# Patient Record
Sex: Female | Born: 1970 | Race: Asian | Hispanic: Yes | Marital: Married | Smoking: Never smoker
Health system: Southern US, Community
[De-identification: ages and names within clinical notes are randomized; demographics above are authoritative.]

---

## 2018-10-18 ENCOUNTER — Encounter (HOSPITAL_COMMUNITY): Payer: Self-pay

## 2018-10-23 ENCOUNTER — Encounter (HOSPITAL_COMMUNITY): Payer: Self-pay | Admitting: Emergency Medicine

## 2018-10-23 ENCOUNTER — Ambulatory Visit (HOSPITAL_COMMUNITY)
Admission: EM | Admit: 2018-10-23 | Discharge: 2018-10-23 | Disposition: A | Discharge status: Home / Self Care | Payer: Self-pay | Attending: Family Medicine | Admitting: Family Medicine

## 2018-10-23 DIAGNOSIS — R21 Rash and other nonspecific skin eruption: Secondary | ICD-10-CM

## 2018-10-23 NOTE — Discharge Instructions (Addendum)
Please try taking benadryl at night  Pleas try allegra, claritin or xyzal during the day  Please follow up if 2-3 days if your symptoms fail to improve.   Ronchas Hives Las ronchas son zonas enrojecidas e hinchadas en la piel que ocasionan picazn. Las ronchas pueden aparecer en cualquier parte del cuerpo. Las ronchas suelen mejorar en el transcurso de 24 horas (ronchas [place]). Pueden aparecer ronchas nuevas despus de que las viejas desaparecen. Esto puede seguir [employer] o semanas (ronchas crnicas). No se transmiten de persona a persona (no son contagiosas). Las ronchas son causadas por la respuesta del organismo a algo a lo que usted es Best boy (alrgeno). A veces se los llama factores desencadenantes. Puede tener ronchas inmediatamente despus de estar cerca de un factor desencadenante u horas ms tarde. Cules son las causas? Las [employer] a los alimentos. Las picaduras o mordeduras de insectos. Polen. Mascotas. Ltex. Productos qumicos. Pasar tiempo a la luz del sol, al calor o al fro. Realizar actividad fsica. Estrs. Algunos medicamentos. Virus. Esto incluye el resfro comn. Las infecciones causadas por grmenes (bacterias). Vacunas contra la Programmer, multimedia. Transfusiones de [place]. A veces, la causa es desconocida. Qu incrementa el riesgo? Ser mujer. Ser alrgico a alimentos tales como: Frutas ctricas. Leche. Huevos. Manes. Frutos secos. Mariscos. Ser alrgico a: Medicamentos. Ltex. Insectos. Animales. Polen. Cules son los signos o los sntomas?  Protuberancias o reas elevadas en la piel, de color rojo o blanco y que producen picazn. Estas reas: Se hacen grandes y se hinchan. [place] de forma y [place]. Aparecen solas o se conectan entre s en una gran rea de piel. Pinchan o causar un dolor punzante. Pueden volverse blancas (palidecer) al presionar en el centro. En [employer] graves, las [place], los pies y el rostro tambin pueden  hincharse. Esto puede ocurrir si las ronchas comienzan en las capas profundas de la piel. Cmo se trata? El tratamiento de esta afeccin depende de los sntomas. El tratamiento puede incluir: Usar paos fros y hmedos (compresas fras) o tomar duchas con agua fra para Psychologist, sport and exercise. Medicamentos para lo siguiente: Associate Professor (antihistamnicos). Reducir la hinchazn (corticoesteroides). Tratar la infeccin (antibiticos). Un medicamento (omalizumab) que se aplica como una inyeccin. El mdico puede recetarle esto si usted presenta ronchas que no mejoran an despus de otros tratamientos. En casos muy graves, puede necesitar una inyeccin de un medicamento denominado epinefrinapara prevenir una reaccin alrgica potencialmente mortal (anafilaxis). Siga estas indicaciones en su casa: Medicamentos [place] o aplique los medicamentos de venta libre y los recetados solamente como se lo haya indicado el mdico. Si le recetaron un antibitico, tmelo como se lo haya indicado el mdico. No deje de usarlo aunque comience a sentirse mejor. Cuidado de la piel Aplique paos fros y hmedos en las ronchas. No se rasque la piel. No se frote la piel. Indicaciones generales No se duche ni tome baos de inmersin con agua caliente. Podra empeorar la picazn. No use ropa ajustada. Aplquese pantalla solar y use ropas que le cubran la piel cuando est al [place]. Evite los factores desencadenantes que le causan las ronchas. Lleve un registro para Presenter, broadcasting de aquello que le causa ronchas. [place] los siguientes datos: Los medicamentos que toma. Lo que usted come y bebe. Los productos que [place] en la piel. Concurra a todas las visitas de [address] se lo haya indicado el mdico. Esto es importante. Comunquese con un mdico si: Los sntomas no mejoran con los medicamentos. Le duelen las  articulaciones o estn hinchadas. Solicite ayuda inmediatamente si: Tiene  fiebre. Siente dolor en el vientre (abdomen). Tiene la lengua o los labios hinchados. Tiene los prpados hinchados. Siente el pecho o la garganta cerrados. Tiene problemas para respirar o tragar. Estos sntomas pueden Customer service manager. No espere a ver si los sntomas desaparecen. Solicite atencin mdica de inmediato. Comunquese con el servicio de emergencias de su localidad (911 en los [place]). No conduzca por sus propios medios [facility]. Resumen Las ronchas son zonas enrojecidas e hinchadas en la piel que ocasionan picazn. El tratamiento de esta afeccin depende de los sntomas. Evite las cosas que causan las ronchas. Lleve un registro para Presenter, broadcasting de aquello que le causa ronchas. Tome y [employer] medicamentos de venta libre y los recetados solamente como se lo haya indicado el mdico. Oceanographer a todas las visitas de seguimiento como se lo haya indicado el mdico. Esto es importante. Esta informacin no tiene Theme park manager el consejo del mdico. Asegrese de hacerle al mdico cualquier pregunta que tenga.

## 2018-10-23 NOTE — ED Provider Notes (Signed)
[facility]    CSN: [id number] Arrival date & time: 10/23/18  1742     History   Chief Complaint Chief Complaint  Patient presents with  . Rash    HPI Virginia Mathis is a 48 y.o. female. Virginia Mathis is a 48 year old female is presenting with rash.  Seems like the symptoms get worse over the past week.  There is only occurring on her face.  She denies any trouble swallowing.  Has not tried any medications.  Denies any new or different exposures.  Has been doing the same job for the past 3 years.  Is mainly occurring just on her face and nowhere else.  No shortness of breath or trouble swallowing.  No new or different medications.  HPI  History reviewed. No pertinent past medical history.  There are no active problems to display for this patient.   History reviewed. No pertinent surgical history.  OB History   No obstetric history on file.      Home Medications    Prior to Admission medications   Not on File    Family History Family History  Family history unknown: Yes    Social History Social History   Tobacco Use  . Smoking status: Never Smoker  . Smokeless tobacco: Never Used  Substance Use Topics  . Alcohol use: Not Currently  . Drug use: Never     Allergies   Patient has no known allergies.   Review of Systems Review of Systems  Constitutional: Negative for fever.  HENT: Negative for congestion.   Respiratory: Negative for cough.   Cardiovascular: Negative for chest pain.  Gastrointestinal: Negative for abdominal pain.  Musculoskeletal: Negative for back pain.  Skin: Positive for rash.  Psychiatric/Behavioral: Negative for agitation.     Physical Exam Triage Vital Signs ED Triage Vitals [10/23/18 1947]  Enc Vitals Group     BP (!) 133/95     Pulse Rate 70     Resp 18     Temp 98.6 F (37 C)     Temp Source Oral     SpO2 100 %     Weight      Height      Head Circumference      Peak Flow      Pain Score 0     Pain Loc    Pain Edu?      Excl. in GC?    No data found.  Updated Vital Signs BP (!) 133/95 (BP Location: Right Arm)   Pulse 70   Temp 98.6 F (37 C) (Oral)   Resp 18   SpO2 100%   Visual Acuity Right Eye Distance:   Left Eye Distance:   Bilateral Distance:    Right Eye Near:   Left Eye Near:    Bilateral Near:     Physical Exam Gen: NAD, alert, cooperative with exam, well-appearing ENT: normal lips, normal nasal mucosa,  Eye: normal EOM, normal conjunctiva and lids CV:  no edema, +2 pedal pulses   Resp: no accessory muscle use, non-labored,  Skin: Raised, erythematous plaques occurring on the face Neuro: normal tone, normal sensation to touch Psych:  normal insight, alert and oriented MSK: Normal gait, normal strength  UC Treatments / Results  Labs (all labs ordered are listed, but only abnormal results are displayed) Labs Reviewed - No data to display  EKG None  Radiology No results found.  Procedures Procedures (including critical care time)  Medications Ordered in UC Medications -  No data to display  Initial Impression / Assessment and Plan / UC Course  I have reviewed the triage vital signs and the nursing notes.  Pertinent labs & imaging results that were available during my care of the patient were reviewed by me and considered in my medical decision making (see chart for details).     Virginia Mathis is a 48 year old female is presenting with rash.  Symptoms seem to be consistent with urticaria.  No new or different exposures to explain.  Counseled on using antihistamines.  Given indications to follow-up and return.  . Final Clinical Impressions(s) / UC Diagnoses   Final diagnoses:  Rash     Discharge Instructions     Please try taking benadryl at night  Pleas try allegra, claritin or xyzal during the day  Please follow up if 2-3 days if your symptoms fail to improve.   Virginia Mathis Las [place] son zonas enrojecidas e hinchadas en la piel que ocasionan  picazn. Las [place] pueden aparecer en cualquier parte del cuerpo. Las [place] suelen mejorar en el transcurso de 24 horas ([place] Virginia Mathis). Pueden aparecer [place] nuevas despus de que las viejas desaparecen. Esto puede seguir Virginia Mathis o semanas ([place] crnicas). No se transmiten de persona a persona (no son contagiosas). Las [place] son causadas por la respuesta del organismo a algo a lo que usted es Virginia Mathis (alrgeno). A veces se los llama factores desencadenantes. Puede tener [place] inmediatamente despus de estar cerca de un factor desencadenante u horas ms tarde. Cules son las causas?  Las [employer] a los alimentos.  Las picaduras o mordeduras de insectos.  Polen.  Mascotas.  Ltex.  Productos qumicos.  Pasar tiempo a la luz del sol, al calor o al fro.  Realizar actividad fsica.  Estrs.  Algunos medicamentos.  Virus. Esto incluye el resfro comn.  Las infecciones causadas por grmenes (bacterias).  Vacunas contra la Programmer, multimedia.  Transfusiones de [place]. A veces, la causa es desconocida. Qu incrementa el riesgo?  Ser mujer.  Ser alrgico a alimentos tales como: ? Frutas ctricas. ? Leche. ? Huevos. ? Manes. ? Frutos secos. ? Mariscos.  Ser alrgico a: ? Medicamentos. ? Ltex. ? Insectos. ? Animales. ? Polen. Cules son los signos o los sntomas?   Protuberancias o reas elevadas en la piel, de color rojo o blanco y que producen picazn. Estas reas: ? Se hacen grandes y se hinchan. ? [place] de forma y [place]. ? Aparecen solas o se conectan entre s en una gran rea de piel. ? Pinchan o causar un dolor punzante. ? Pueden volverse blancas (palidecer) al presionar en el centro. En [employer] graves, las [place], los pies y el rostro tambin pueden hincharse. Esto puede ocurrir si las [place] comienzan en las capas profundas de la piel. Cmo se trata? El tratamiento de esta afeccin depende de los sntomas. El tratamiento puede  incluir:  Usar paos fros y hmedos (compresas fras) o tomar duchas con agua fra para Psychologist, sport and exercise.  Medicamentos para lo siguiente: ? Aliviar la picazn (antihistamnicos). ? Reducir la hinchazn (corticoesteroides). ? Tratar la infeccin (antibiticos).  Un medicamento (omalizumab) que se aplica como una inyeccin. El mdico puede recetarle esto si usted presenta [place] que no mejoran an despus de otros tratamientos.  En casos muy graves, puede necesitar una inyeccin de un medicamento denominado epinefrinapara prevenir una reaccin alrgica potencialmente mortal (anafilaxis). Siga estas indicaciones en su casa: Medicamentos  Tome o aplique los medicamentos de venta libre y los recetados  solamente como se lo haya indicado el mdico.  Si le recetaron un antibitico, tmelo como se lo haya indicado el mdico. No deje de usarlo aunque comience a sentirse mejor. Cuidado de la piel  Aplique paos fros y hmedos en las [place].  No se rasque la piel. No se frote la piel. Indicaciones generales  No se duche ni tome baos de inmersin con agua caliente. Podra empeorar la picazn.  No use ropa ajustada.  Aplquese pantalla solar y use ropas que le cubran la piel cuando est al [place].  Evite los factores desencadenantes que le causan las [place]. Lleve un registro para Presenter, broadcasting de aquello que le causa [place]. Escriba los siguientes datos: ? Los medicamentos que toma. ? Lo que usted come y bebe. ? Los productos que [place] en la piel.  Concurra a todas las visitas de [address] se lo haya indicado el mdico. Esto es importante. Comunquese con un mdico si:  Los sntomas no mejoran con los medicamentos.  Le duelen las articulaciones o estn hinchadas. Solicite ayuda inmediatamente si:  Tiene fiebre.  Siente dolor en el vientre (abdomen).  Tiene la lengua o los labios hinchados.  Tiene los prpados hinchados.  Siente el pecho o la  garganta cerrados.  Tiene problemas para respirar o tragar. Estos sntomas pueden Customer service manager. No espere a ver si los sntomas desaparecen. Solicite atencin mdica de inmediato. Comunquese con el servicio de emergencias de su localidad (911 en los [place]). No conduzca por sus propios medios [facility] hospital. Resumen  Las [place] son zonas enrojecidas e hinchadas en la piel que ocasionan picazn.  El tratamiento de esta afeccin depende de los sntomas.  Evite las cosas que causan las [place]. Lleve un registro para Presenter, broadcasting de aquello que le causa [place].  Tome y [employer] medicamentos de venta libre y los recetados solamente como se lo haya indicado el mdico.  Oceanographer a todas las visitas de seguimiento como se lo haya indicado el mdico. Esto es importante. Esta informacin no tiene Theme park manager el consejo del mdico. Asegrese de hacerle al mdico cualquier pregunta que tenga.    ED Prescriptions    None     Controlled Substance Prescriptions [place] Controlled Substance Registry consulted? Not Applicable   Myra Rude, MD 10/23/18 2146

## 2018-10-23 NOTE — ED Triage Notes (Signed)
Pt here for rash and itching to face; pt thinks it is from allergies

## 2019-04-18 ENCOUNTER — Other Ambulatory Visit (HOSPITAL_COMMUNITY): Payer: Self-pay | Admitting: *Deleted

## 2019-04-18 DIAGNOSIS — Z1231 Encounter for screening mammogram for malignant neoplasm of breast: Secondary | ICD-10-CM

## 2019-07-12 ENCOUNTER — Encounter (HOSPITAL_COMMUNITY): Payer: Self-pay

## 2019-07-12 ENCOUNTER — Ambulatory Visit (HOSPITAL_COMMUNITY)
Admission: RE | Admit: 2019-07-12 | Discharge: 2019-07-12 | Disposition: A | Discharge status: Home / Self Care | Payer: Self-pay | Source: Ambulatory Visit | Attending: Obstetrics and Gynecology | Admitting: Obstetrics and Gynecology

## 2019-07-12 ENCOUNTER — Other Ambulatory Visit: Payer: Self-pay

## 2019-07-12 ENCOUNTER — Ambulatory Visit
Admission: RE | Admit: 2019-07-12 | Discharge: 2019-07-12 | Disposition: A | Discharge status: Home / Self Care | Payer: No Typology Code available for payment source | Source: Ambulatory Visit | Attending: Obstetrics and Gynecology | Admitting: Obstetrics and Gynecology

## 2019-07-12 DIAGNOSIS — Z1231 Encounter for screening mammogram for malignant neoplasm of breast: Secondary | ICD-10-CM

## 2019-07-12 DIAGNOSIS — Z1239 Encounter for other screening for malignant neoplasm of breast: Secondary | ICD-10-CM | POA: Insufficient documentation

## 2019-07-12 NOTE — Patient Instructions (Signed)
Explained breast self awareness with Kathrin Greathouse. Patient did not need a Pap smear today due to last Pap smear was in February 2020 per patient. Let her know [facility] will cover Pap smears every 3 years unless has a history of abnormal Pap smears. Referred patient to the [facility] for a screening mammogram. Appointment scheduled for Thursday, July 12, 2019 at 1610. Patient aware of appointment and will be there. Let patient know the [facility] will follow up with her within the next couple weeks with results of mammogram by letter or phone. Marice Guidone verbalized understanding.  Abraham Margulies, Arvil Chaco, RN 3:20 PM

## 2019-07-12 NOTE — Progress Notes (Signed)
No complaints today.   Pap Smear: Pap smear not completed today. Last Pap smear was in February 2020 at the [facility] and normal per patient. Per patient has no history of an abnormal Pap smear. No Pap smear results are in Epic.  Physical exam: Breasts Breasts symmetrical. No skin abnormalities bilateral breasts. No nipple retraction bilateral breasts. No nipple discharge bilateral breasts. No lymphadenopathy. No lumps palpated bilateral breasts. No complaints of pain or tenderness on exam. Referred patient to the [facility] for a screening mammogram. Appointment scheduled for Thursday, July 12, 2019 at 1610.        Pelvic/Bimanual No Pap smear completed today since last Pap smear was in February 2020 per patient. Pap smear not indicated per [facility] guidelines.   Smoking History: Patient has never smoked.  Patient Navigation: Patient education provided. Access to services provided for patient through [facility] program. Spanish interpreter provided.   Breast and Cervical Cancer Risk Assessment: Patient has no family history of breast cancer, known genetic mutations, or radiation treatment to the chest before age 15. Patient has no history of cervical dysplasia, immunocompromised, or DES exposure in-utero.  Risk Assessment    Risk Scores      07/12/2019   Last edited by: Loletta Parish, RN   5-year risk: 0.5 %   Lifetime risk: 5.4 %         Used Spanish interpreter Rudene Anda from [place].

## 2019-07-18 ENCOUNTER — Other Ambulatory Visit: Payer: Self-pay | Admitting: Obstetrics and Gynecology

## 2019-07-18 DIAGNOSIS — R928 Other abnormal and inconclusive findings on diagnostic imaging of breast: Secondary | ICD-10-CM

## 2019-09-17 ENCOUNTER — Other Ambulatory Visit: Payer: Self-pay

## 2019-09-17 ENCOUNTER — Ambulatory Visit
Admission: RE | Admit: 2019-09-17 | Discharge: 2019-09-17 | Disposition: A | Discharge status: Home / Self Care | Payer: No Typology Code available for payment source | Source: Ambulatory Visit | Attending: Obstetrics and Gynecology | Admitting: Obstetrics and Gynecology

## 2019-09-17 ENCOUNTER — Other Ambulatory Visit: Payer: Self-pay | Admitting: Obstetrics and Gynecology

## 2019-09-17 DIAGNOSIS — R928 Other abnormal and inconclusive findings on diagnostic imaging of breast: Secondary | ICD-10-CM

## 2019-09-17 DIAGNOSIS — R921 Mammographic calcification found on diagnostic imaging of breast: Secondary | ICD-10-CM

## 2019-09-17 DIAGNOSIS — N6310 Unspecified lump in the right breast, unspecified quadrant: Secondary | ICD-10-CM

## 2019-09-26 ENCOUNTER — Ambulatory Visit
Admission: RE | Admit: 2019-09-26 | Discharge: 2019-09-26 | Disposition: A | Discharge status: Home / Self Care | Payer: No Typology Code available for payment source | Source: Ambulatory Visit | Attending: Obstetrics and Gynecology | Admitting: Obstetrics and Gynecology

## 2019-09-26 ENCOUNTER — Other Ambulatory Visit: Payer: Self-pay

## 2019-09-26 DIAGNOSIS — R921 Mammographic calcification found on diagnostic imaging of breast: Secondary | ICD-10-CM

## 2019-09-26 DIAGNOSIS — N6310 Unspecified lump in the right breast, unspecified quadrant: Secondary | ICD-10-CM

## 2019-09-26 HISTORY — PX: BREAST BIOPSY: SHX20

## 2019-12-22 ENCOUNTER — Ambulatory Visit: Payer: No Typology Code available for payment source | Attending: Internal Medicine

## 2019-12-22 DIAGNOSIS — Z23 Encounter for immunization: Secondary | ICD-10-CM

## 2019-12-22 NOTE — Progress Notes (Signed)
   Covid-19 Vaccination Clinic  Name:  Virginia Mathis    MRN: [id number] DOB: 1971-04-30  12/22/2019  Ms. Virginia Mathis was observed post Covid-19 immunization for 15 minutes without incident. She was provided with Vaccine Information Sheet and instruction to access the V-Safe system.   Ms. Virginia Mathis was instructed to call 911 with any severe reactions post vaccine: Marland Kitchen Difficulty breathing  . Swelling of face and throat  . A fast heartbeat  . A bad rash all over body  . Dizziness and weakness   Immunizations Administered    Name Date Dose VIS Date Route   Pfizer COVID-19 Vaccine 12/22/2019 10:34 AM 0.3 mL 10/17/2018 Intramuscular   Manufacturer: [employer], [employer]   Lot: [id number]   NDC: [address]-8

## 2020-01-01 ENCOUNTER — Ambulatory Visit
Admission: EM | Admit: 2020-01-01 | Discharge: 2020-01-01 | Disposition: A | Discharge status: Home / Self Care | Payer: Self-pay | Attending: Physician Assistant | Admitting: Physician Assistant

## 2020-01-01 DIAGNOSIS — R059 Cough, unspecified: Secondary | ICD-10-CM

## 2020-01-01 DIAGNOSIS — R519 Headache, unspecified: Secondary | ICD-10-CM

## 2020-01-01 DIAGNOSIS — R05 Cough: Secondary | ICD-10-CM

## 2020-01-01 DIAGNOSIS — J029 Acute pharyngitis, unspecified: Secondary | ICD-10-CM

## 2020-01-01 MED ORDER — PREDNISONE 50 MG PO TABS: 50.0000 mg | ORAL_TABLET | Freq: Every day | ORAL | 0 refills | Status: DC

## 2020-01-01 MED ORDER — FLUTICASONE PROPIONATE 50 MCG/ACT NA SUSP: 2.0000 | Freq: Every day | NASAL | 0 refills | Status: AC

## 2020-01-01 MED ORDER — LIDOCAINE VISCOUS HCL 2 % MT SOLN: OROMUCOSAL | 0 refills | Status: DC

## 2020-01-01 MED ORDER — FLUTICASONE PROPIONATE 50 MCG/ACT NA SUSP: 2.0000 | Freq: Every day | NASAL | 0 refills | Status: DC

## 2020-01-01 NOTE — ED Provider Notes (Signed)
[facility]    CSN: [id number] Arrival date & time: 01/01/20  1340      History   Chief Complaint Chief Complaint  Patient presents with  . Sore Throat    HPI Beautiful Pensyl Ashlye Oviedo is a 49 y.o. female.   49 year old female comes in for 5 day of URI symptoms. Sore throat, body aches, cough, frontal headache. Denies fever, chills. Denies abdominal pain, nausea, vomiting, diarrhea. Denies shortness of breath, loss of taste/smell. States had first pfizer covid vaccine a week ago Saturday. Rapid COVID test yesterday negative     History reviewed. No pertinent past medical history.  Patient Active Problem List   Diagnosis Date Noted  . Screening breast examination 07/12/2019    History reviewed. No pertinent surgical history.  OB History    Gravida  3   Para      Term      Preterm      AB      Living  3     SAB      TAB      Ectopic      Multiple      Live Births  3            Home Medications    Prior to Admission medications   Medication Sig Start Date End Date Taking? Authorizing Provider  fluticasone (FLONASE) 50 MCG/ACT nasal spray Place 2 sprays into both nostrils daily. 01/01/20   Cathie Hoops, Correen Bubolz V, PA-C  lidocaine (XYLOCAINE) 2 % solution 5-15 mL gurgle as needed 01/01/20   Cathie Hoops, Lafayette Dunlevy V, PA-C  predniSONE (DELTASONE) 50 MG tablet Take 1 tablet (50 mg total) by mouth daily with breakfast. 01/01/20   Belinda Fisher, PA-C    Family History Family History  Problem Relation Age of Onset  . Diabetes Mother   . Diabetes Father     Social History Social History   Tobacco Use  . Smoking status: Never Smoker  . Smokeless tobacco: Never Used  Substance Use Topics  . Alcohol use: Yes    Comment: occ.  . Drug use: Never     Allergies   Patient has no known allergies.   Review of Systems Review of Systems  Reason unable to perform ROS: See HPI as above.     Physical Exam Triage Vital Signs ED Triage Vitals [01/01/20 1355]    Enc Vitals Group     BP 113/80     Pulse Rate 95     Resp 18     Temp 98.1 F (36.7 C)     Temp Source Oral     SpO2 95 %     Weight      Height      Head Circumference      Peak Flow      Pain Score 8     Pain Loc      Pain Edu?      Excl. in GC?    No data found.  Updated Vital Signs BP 113/80 (BP Location: Left Arm)   Pulse 95   Temp 98.1 F (36.7 C) (Oral)   Resp 18   LMP 12/31/2019   SpO2 95%   Physical Exam Constitutional:      General: She is not in acute distress.    Appearance: Normal appearance. She is well-developed. She is not ill-appearing, toxic-appearing or diaphoretic.  HENT:     Head: Normocephalic and atraumatic.     Right Ear:  Ear canal and external ear normal. A middle ear effusion is present. Tympanic membrane is not erythematous or bulging.     Left Ear: Tympanic membrane, ear canal and external ear normal. Tympanic membrane is not erythematous or bulging.     Nose:     Right Sinus: No maxillary sinus tenderness or frontal sinus tenderness.     Left Sinus: No maxillary sinus tenderness or frontal sinus tenderness.     Mouth/Throat:     Mouth: Mucous membranes are moist.     Pharynx: Oropharynx is clear. Uvula midline.     Tonsils: No tonsillar exudate. 1+ on the right. 1+ on the left.  Eyes:     Conjunctiva/sclera: Conjunctivae normal.     Pupils: Pupils are equal, round, and reactive to light.  Cardiovascular:     Rate and Rhythm: Normal rate and regular rhythm.  Pulmonary:     Effort: Pulmonary effort is normal. No accessory muscle usage, prolonged expiration, respiratory distress or retractions.     Breath sounds: No decreased air movement or transmitted upper airway sounds. No decreased breath sounds.     Comments: LCTAB Musculoskeletal:     Cervical back: Normal range of motion and neck supple.  Skin:    General: Skin is warm and dry.  Neurological:     Mental Status: She is alert and oriented to person, place, and time.       UC Treatments / Results  Labs (all labs ordered are listed, but only abnormal results are displayed) Labs Reviewed  NOVEL CORONAVIRUS, NAA    EKG   Radiology No results found.  Procedures Procedures (including critical care time)  Medications Ordered in UC Medications - No data to display  Initial Impression / Assessment and Plan / UC Course  I have reviewed the triage vital signs and the nursing notes.  Pertinent labs & imaging results that were available during my care of the patient were reviewed by me and considered in my medical decision making (see chart for details).    Due to high false negative rate for rapid COVID tests, will send for COVID PCR testing. Patient to quarantine until testing results return. Symptomatic treatment discussed. Return precautions given.  Final Clinical Impressions(s) / UC Diagnoses   Final diagnoses:  Sore throat  Cough  Frontal headache   ED Prescriptions    Medication Sig Dispense Auth. Provider   fluticasone (FLONASE) 50 MCG/ACT nasal spray  (Status: Discontinued) Place 2 sprays into both nostrils daily. 1 g Klani Caridi V, PA-C   lidocaine (XYLOCAINE) 2 % solution  (Status: Discontinued) 5-15 mL gurgle as needed 150 mL Johnrobert Foti V, PA-C   predniSONE (DELTASONE) 50 MG tablet  (Status: Discontinued) Take 1 tablet (50 mg total) by mouth daily with breakfast. 5 tablet Faustine Tates V, PA-C   fluticasone (FLONASE) 50 MCG/ACT nasal spray Place 2 sprays into both nostrils daily. 1 g Franziska Podgurski V, PA-C   lidocaine (XYLOCAINE) 2 % solution 5-15 mL gurgle as needed 150 mL Levon Boettcher V, PA-C   predniSONE (DELTASONE) 50 MG tablet Take 1 tablet (50 mg total) by mouth daily with breakfast. 5 tablet Ok Edwards, PA-C     PDMP not reviewed this encounter.   Ok Edwards, PA-C 01/01/20 1417

## 2020-01-01 NOTE — ED Triage Notes (Signed)
Pt c/o sore throat, body aches, cough and headaches since Thursday. States had first covid vaccine a week ago saturday

## 2020-01-01 NOTE — Discharge Instructions (Addendum)
COVID PCR testing ordered. I would like you to quarantine until testing results. Prednisone for frontal headache. Flonase for right ear fluid. Start lidocaine for sore throat, do not eat or drink for the next 40 mins after use as it can stunt your gag reflex.Tylenol/motrin for pain and fever. Keep hydrated, urine should be clear to pale yellow in color. If experiencing shortness of breath, trouble breathing, go to the emergency department for further evaluation needed.

## 2020-01-02 LAB — NOVEL CORONAVIRUS, NAA: SARS-CoV-2, NAA: DETECTED — AB

## 2020-01-02 LAB — SARS-COV-2, NAA 2 DAY TAT

## 2020-01-03 ENCOUNTER — Telehealth: Payer: Self-pay | Admitting: Unknown Physician Specialty

## 2020-01-03 NOTE — Telephone Encounter (Signed)
Phone contact in error

## 2020-01-14 ENCOUNTER — Ambulatory Visit: Payer: No Typology Code available for payment source | Attending: Internal Medicine

## 2021-05-14 IMAGING — MG DIGITAL DIAGNOSTIC UNILAT RIGHT W/ CAD
3 series · 3 of 3 positions shown · non-contrast
Comparison: Screening study, which was the baseline exam,
07/12/2019.

CLINICAL DATA: Screening recall for right breast calcifications.

EXAM:
DIGITAL DIAGNOSTIC RIGHT MAMMOGRAM
ULTRASOUND RIGHT BREAST

[R CC]
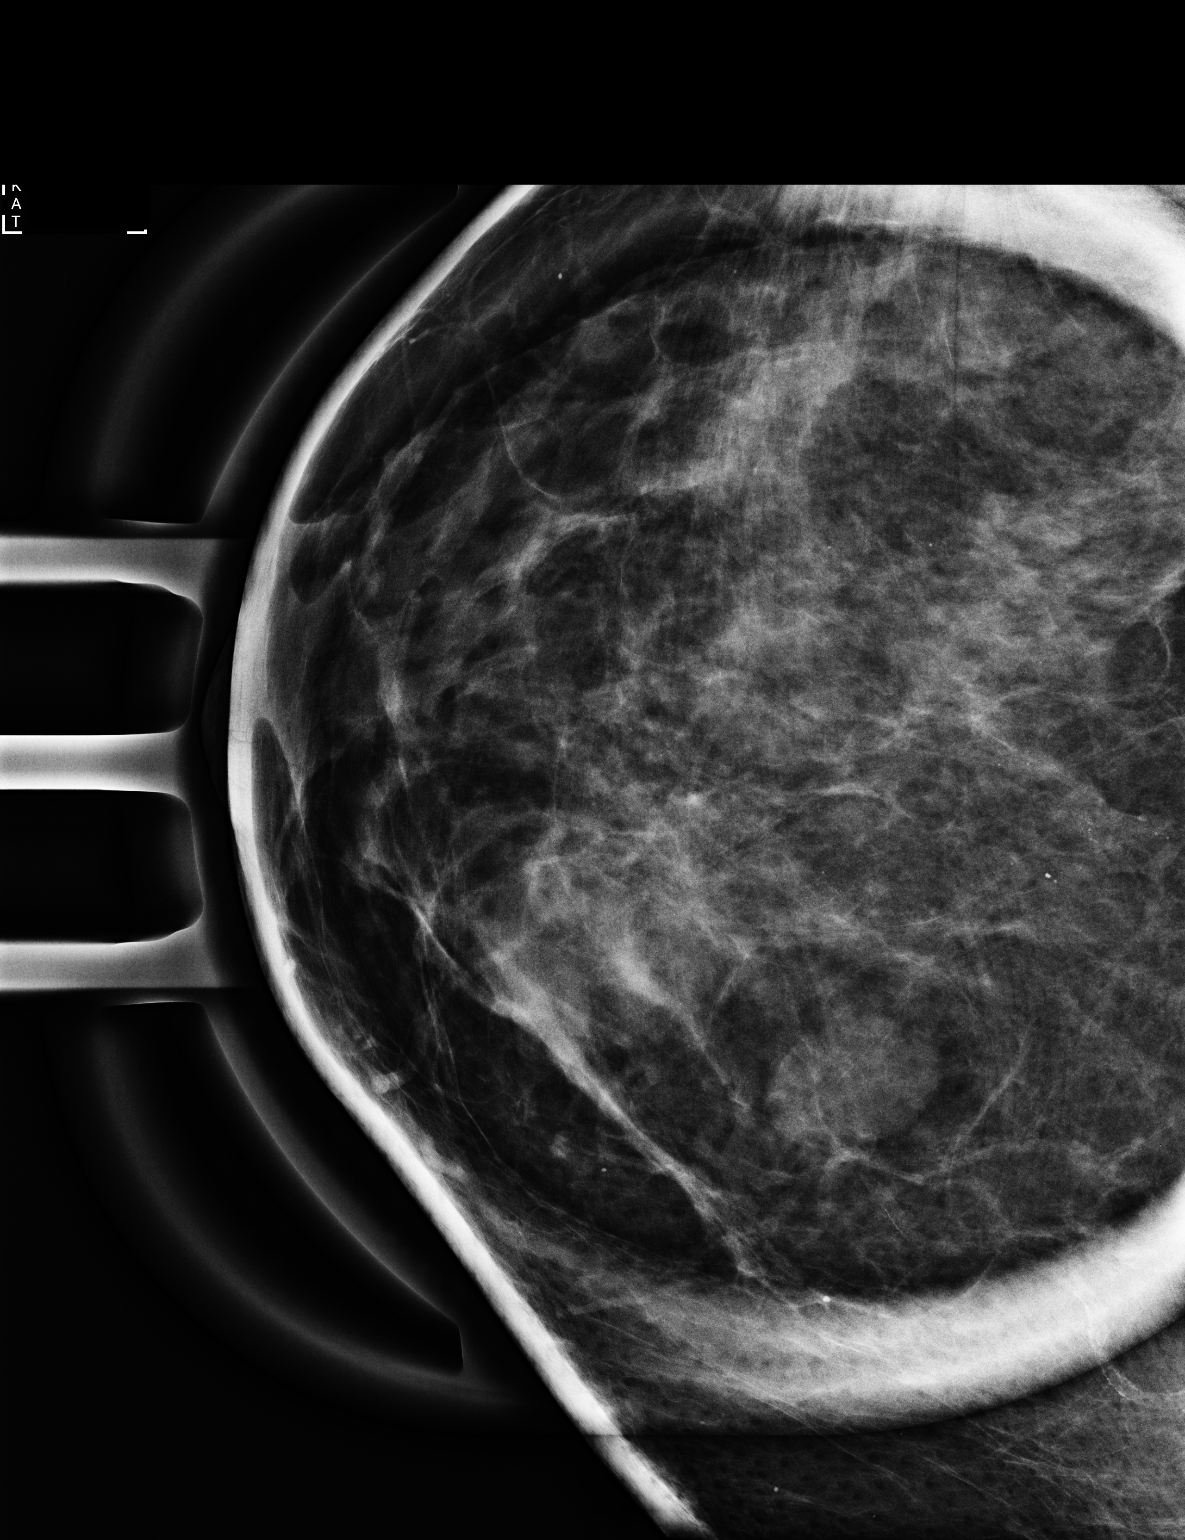

[R ML (1 of 2)]
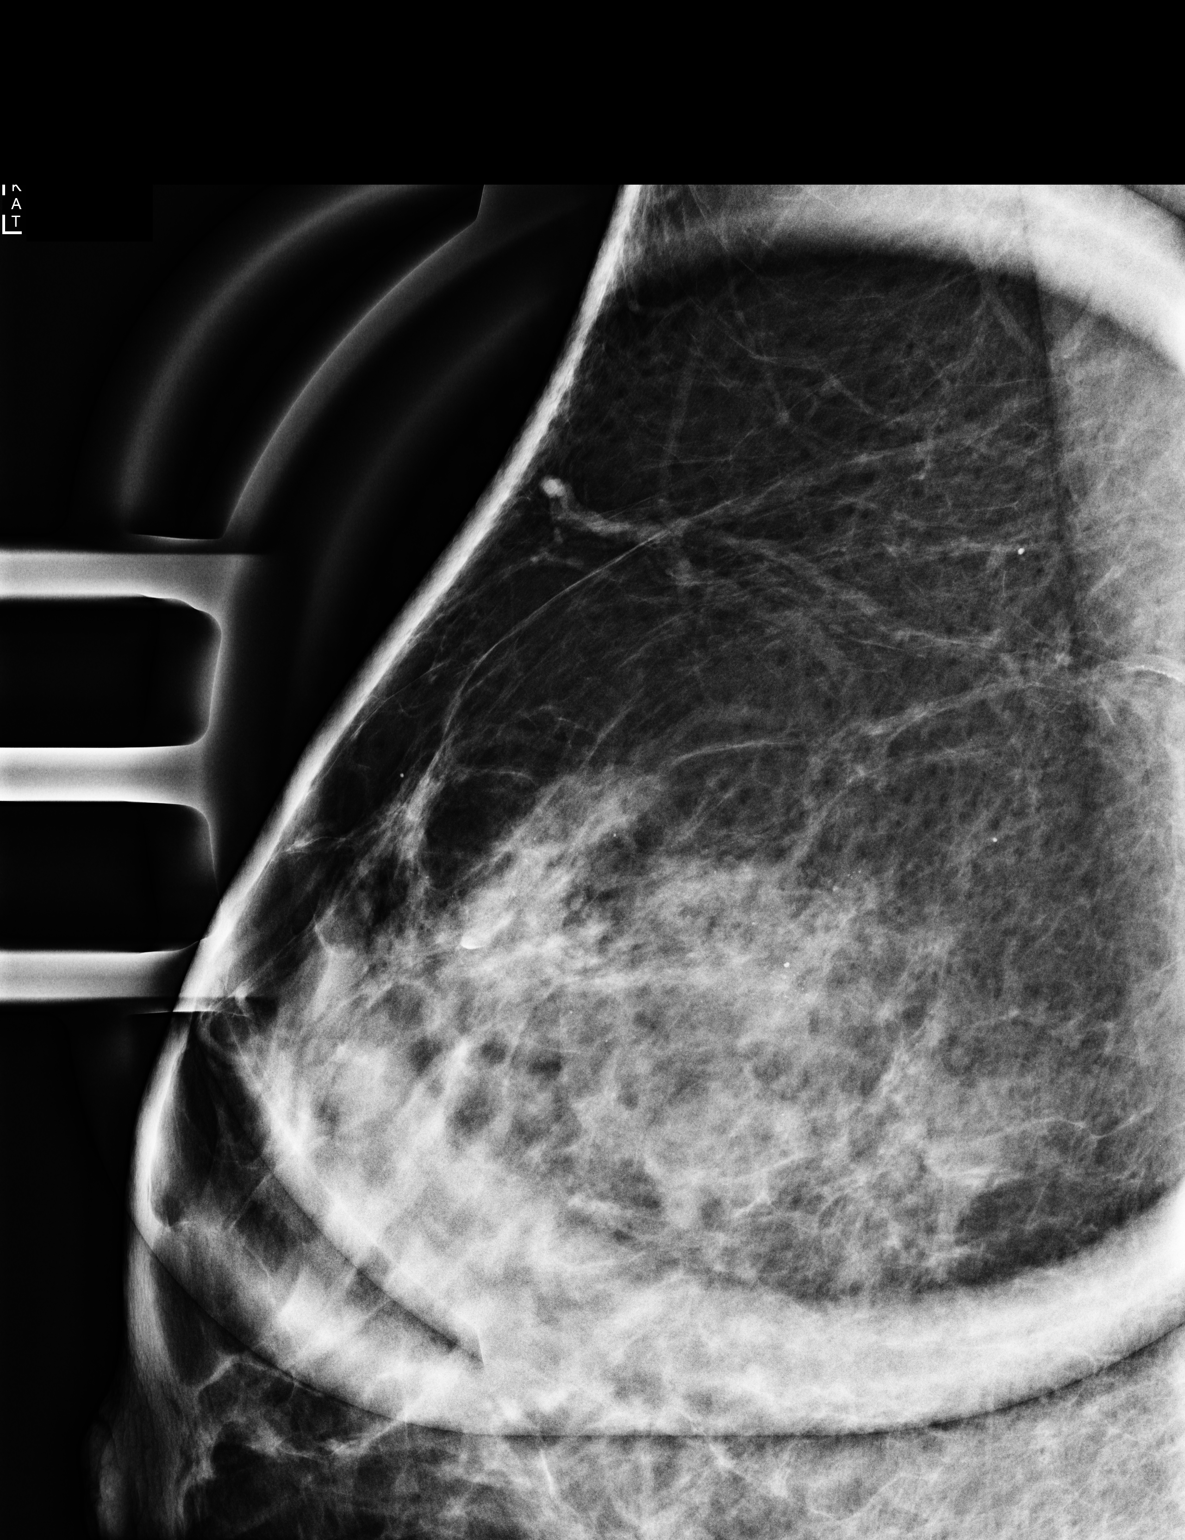

[R ML (2 of 2)]
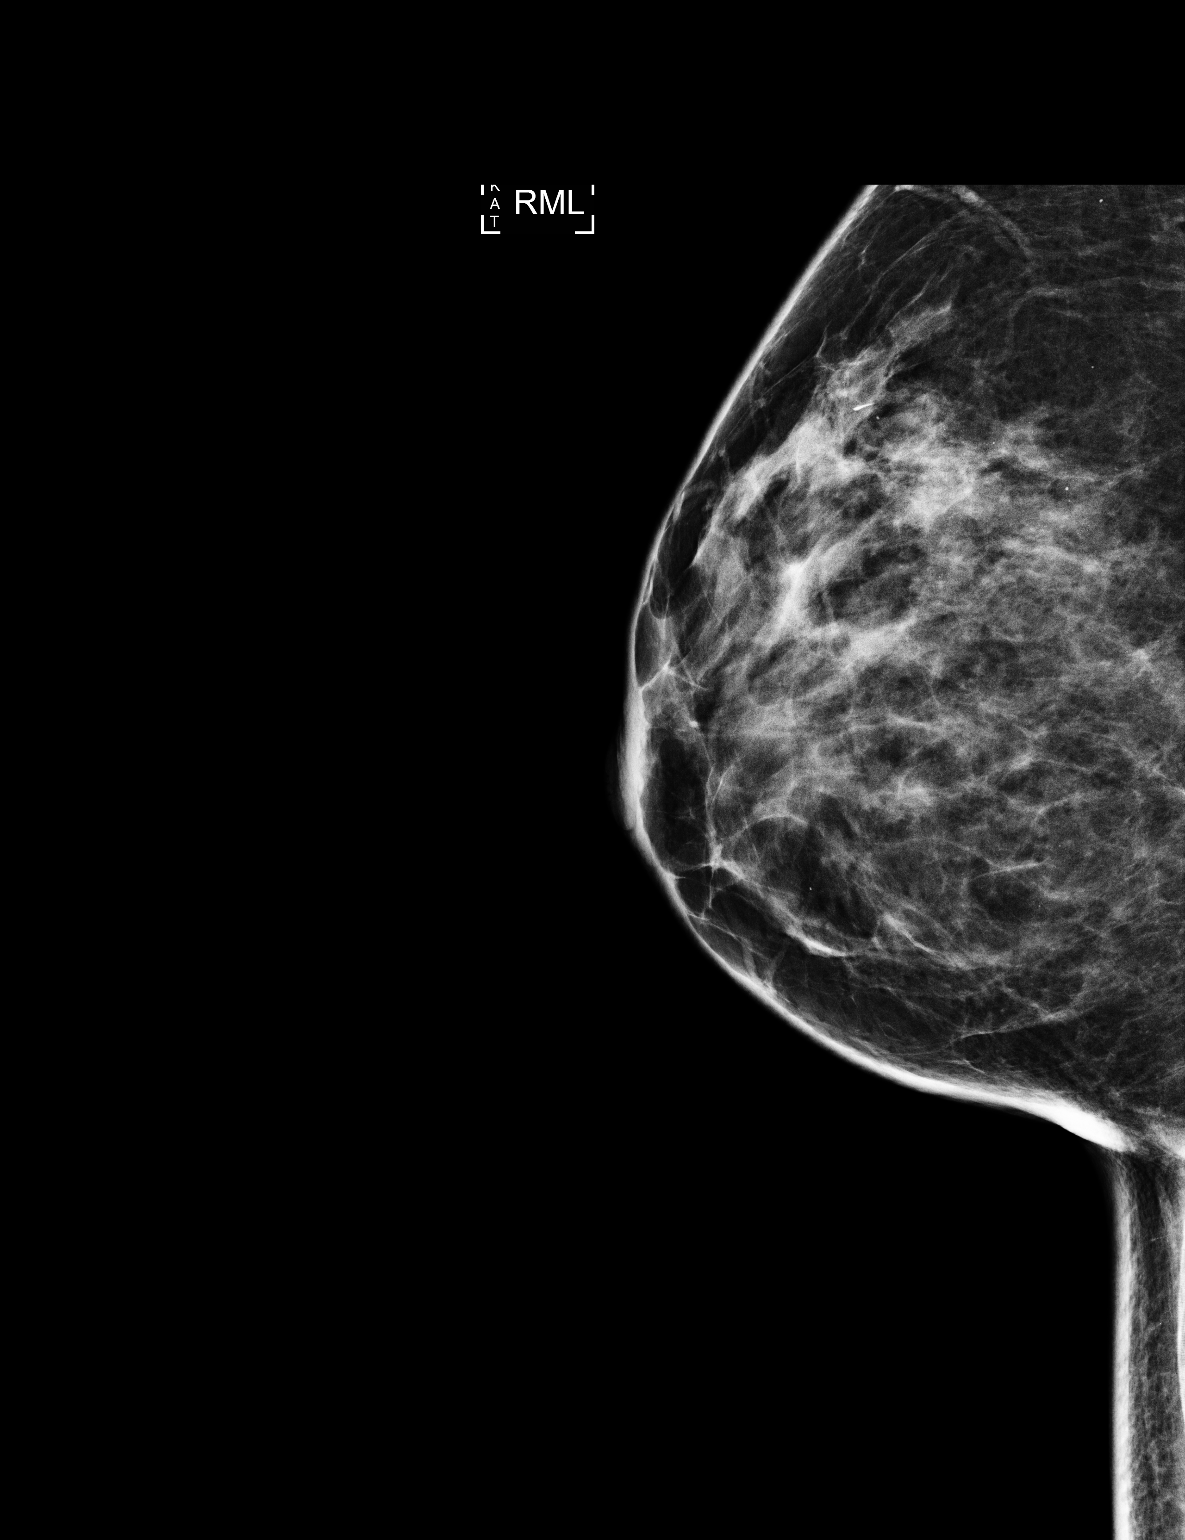

[3 of 3 positions shown; findings below may reference images not displayed]

ACR Breast Density Category c: The breast tissue is heterogeneously
dense, which may obscure small masses.
FINDINGS: The calcifications in the posterior, upper central right breast, are
better evaluated on the magnification images. These are
predominantly punctate, but are somewhat linearly arranged spanning
1.5 cm in long axis. Within these areas a more focal group that
spans 5 mm. There is no associated mass or distortion.

Inferior and medial to the calcifications, there is a circumscribed
oval mass. No other right breast abnormalities.

Targeted ultrasound is performed, showing an oval circumscribed
hypoechoic mass in the right breast at 12:30 o'clock, 2 cm the
nipple, measuring 11 x 6 x 11 mm, corresponding to the mammographic
mass. Sonographic evaluation of the right axilla shows no enlarged
or abnormal lymph nodes.
IMPRESSION: 1. Indeterminate right breast calcifications. Tissue sampling is
recommended.
2. Probably benign right breast mass. Since the calcifications in
this breasts are to undergo biopsy, biopsy is also recommended for
this mass to confirm it as a benign lesion.

RECOMMENDATION:
1. Stereotactic core needle biopsy of the right breast
calcifications.
2. Ultrasound-guided core needle biopsy of the 12:30 o'clock
position right breast mass.

These procedures were scheduled prior to the patient leaving the
[REDACTED].

I have discussed the findings and recommendations with the patient.
If applicable, a reminder letter will be sent to the patient
regarding the next appointment.

BI-RADS CATEGORY  4: Suspicious.

## 2021-05-14 IMAGING — US US BREAST*R* LIMITED INC AXILLA
1 series · 7 of 7 positions shown · non-contrast
Comparison: Screening study, which was the baseline exam,
07/12/2019.

CLINICAL DATA: Screening recall for right breast calcifications.

EXAM:
DIGITAL DIAGNOSTIC RIGHT MAMMOGRAM
ULTRASOUND RIGHT BREAST

[Series 1: us breast*right* limited inc axilla · 0.07mm/px · 7 of 7 slices shown]
[im 1/7]
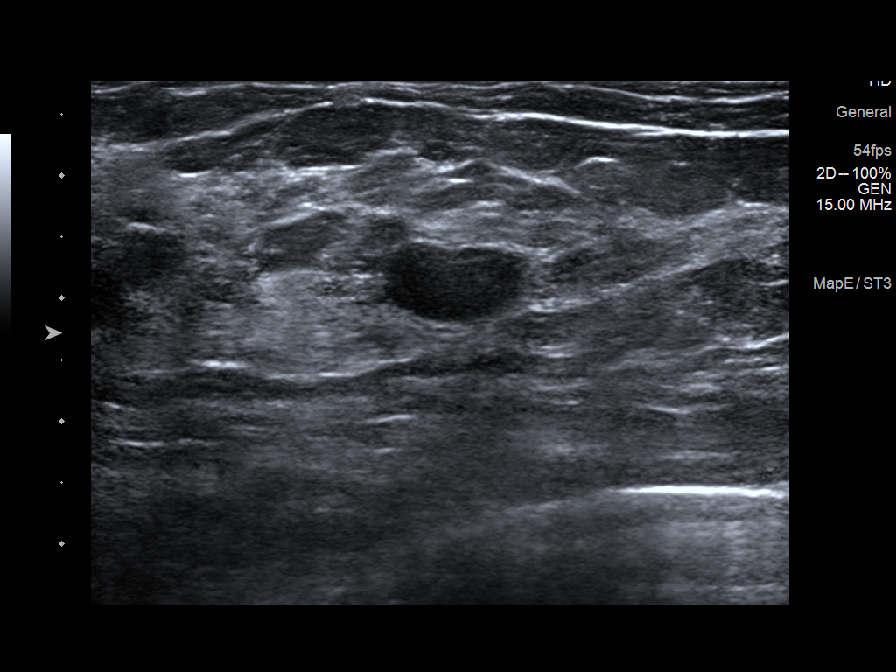
[im 2/7]
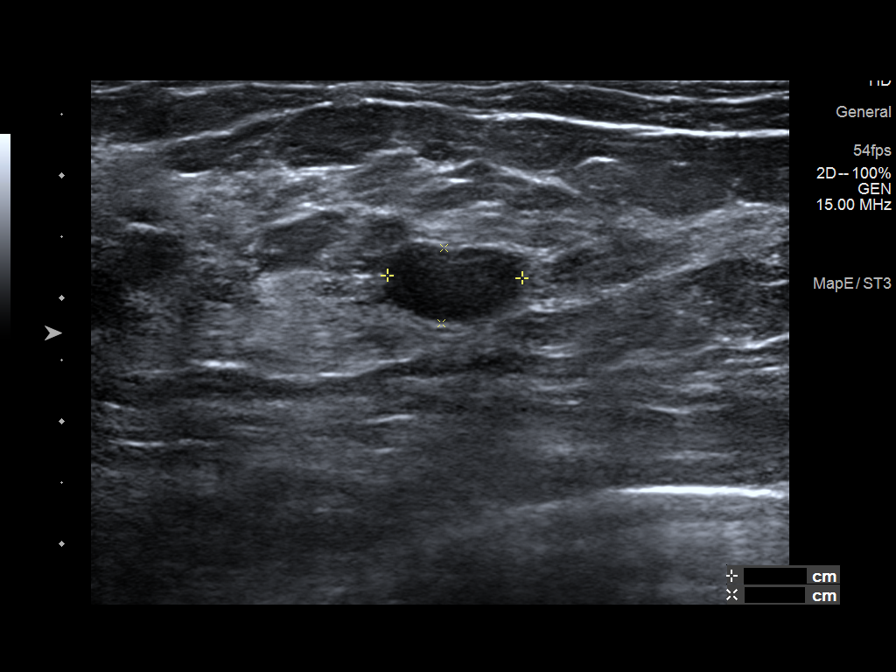
[im 3/7]
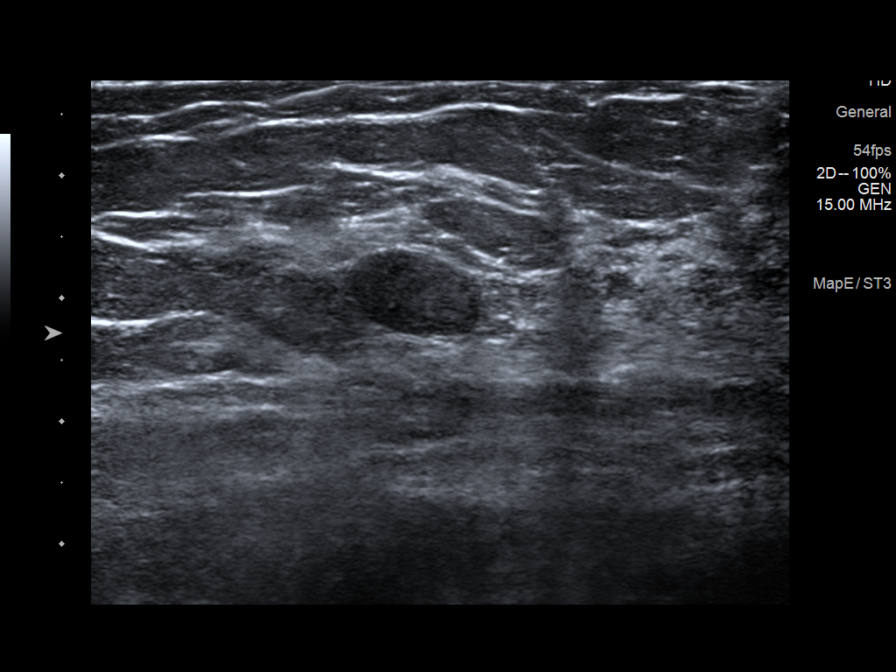
[im 4/7]
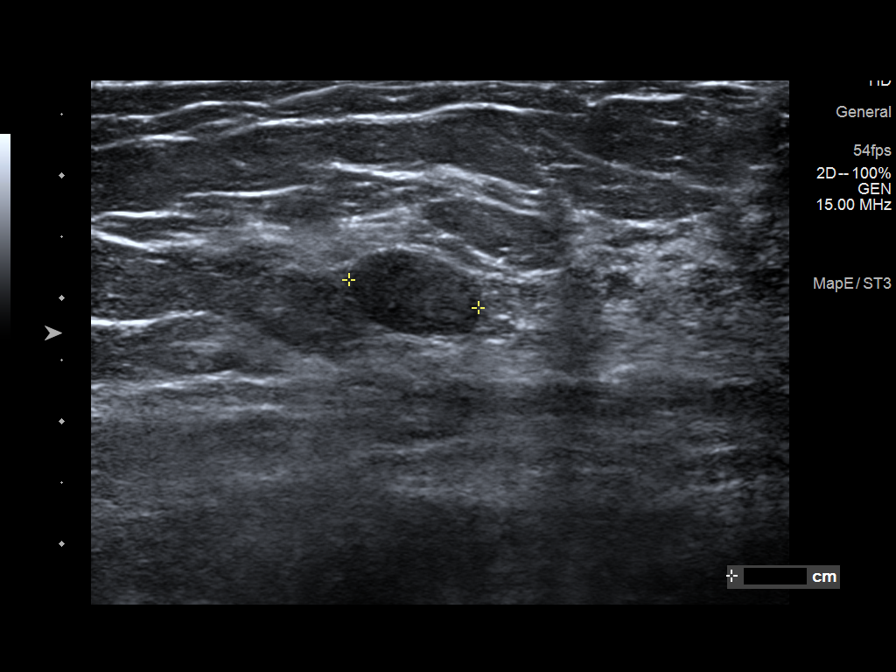
[im 5/7]
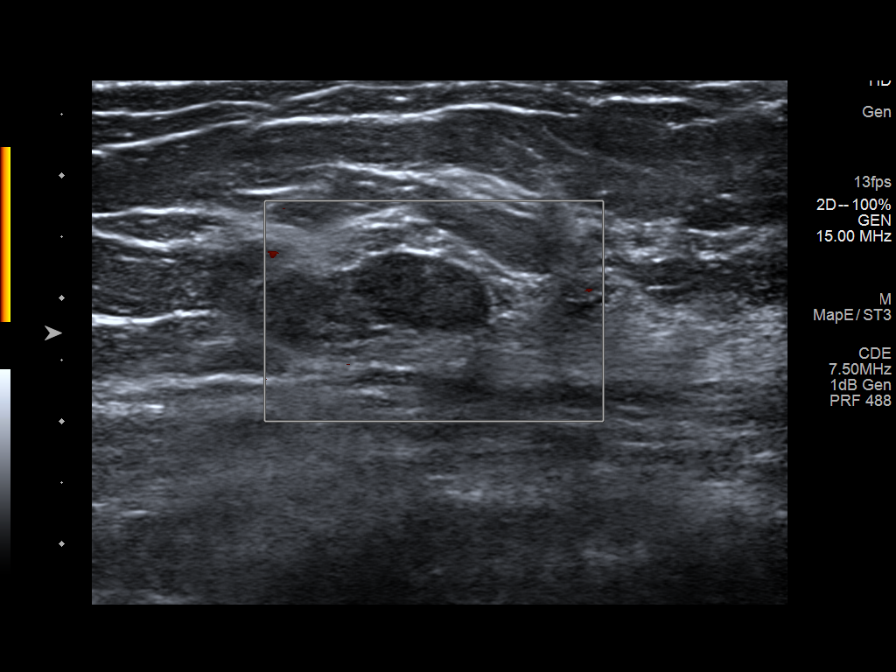
[im 6/7]
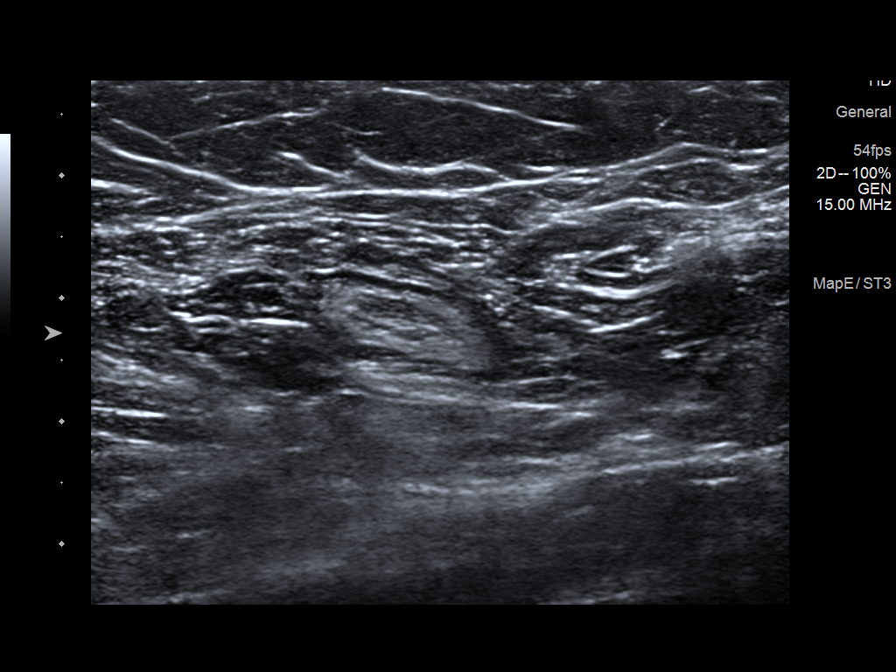
[im 7/7]
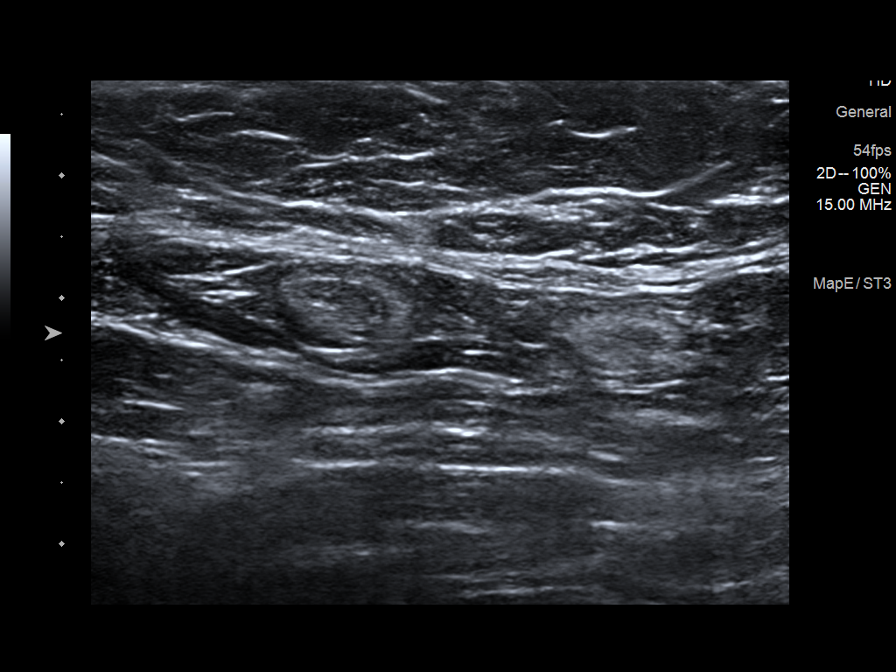

[7 of 7 positions shown; findings below may reference images not displayed]

ACR Breast Density Category c: The breast tissue is heterogeneously
dense, which may obscure small masses.
FINDINGS: The calcifications in the posterior, upper central right breast, are
better evaluated on the magnification images. These are
predominantly punctate, but are somewhat linearly arranged spanning
1.5 cm in long axis. Within these areas a more focal group that
spans 5 mm. There is no associated mass or distortion.

Inferior and medial to the calcifications, there is a circumscribed
oval mass. No other right breast abnormalities.

Targeted ultrasound is performed, showing an oval circumscribed
hypoechoic mass in the right breast at 12:30 o'clock, 2 cm the
nipple, measuring 11 x 6 x 11 mm, corresponding to the mammographic
mass. Sonographic evaluation of the right axilla shows no enlarged
or abnormal lymph nodes.
IMPRESSION: 1. Indeterminate right breast calcifications. Tissue sampling is
recommended.
2. Probably benign right breast mass. Since the calcifications in
this breasts are to undergo biopsy, biopsy is also recommended for
this mass to confirm it as a benign lesion.

RECOMMENDATION:
1. Stereotactic core needle biopsy of the right breast
calcifications.
2. Ultrasound-guided core needle biopsy of the 12:30 o'clock
position right breast mass.

These procedures were scheduled prior to the patient leaving the
[REDACTED].

I have discussed the findings and recommendations with the patient.
If applicable, a reminder letter will be sent to the patient
regarding the next appointment.

BI-RADS CATEGORY  4: Suspicious.

## 2022-12-28 ENCOUNTER — Other Ambulatory Visit: Payer: Self-pay

## 2022-12-28 DIAGNOSIS — Z1231 Encounter for screening mammogram for malignant neoplasm of breast: Secondary | ICD-10-CM

## 2023-01-27 ENCOUNTER — Ambulatory Visit: Payer: Self-pay | Admitting: Hematology and Oncology

## 2023-01-27 ENCOUNTER — Ambulatory Visit
Admission: RE | Admit: 2023-01-27 | Discharge: 2023-01-27 | Disposition: A | Discharge status: Home / Self Care | Payer: No Typology Code available for payment source | Source: Ambulatory Visit | Attending: Obstetrics and Gynecology | Admitting: Obstetrics and Gynecology

## 2023-01-27 VITALS — BP 104/71 | Wt 196.3 lb

## 2023-01-27 DIAGNOSIS — Z01419 Encounter for gynecological examination (general) (routine) without abnormal findings: Secondary | ICD-10-CM

## 2023-01-27 DIAGNOSIS — Z124 Encounter for screening for malignant neoplasm of cervix: Secondary | ICD-10-CM

## 2023-01-27 DIAGNOSIS — Z1231 Encounter for screening mammogram for malignant neoplasm of breast: Secondary | ICD-10-CM

## 2023-01-27 DIAGNOSIS — Z1211 Encounter for screening for malignant neoplasm of colon: Secondary | ICD-10-CM

## 2023-01-27 NOTE — Progress Notes (Signed)
Virginia Mathis is a 52 y.o. G3P0 female who presents to [facility] clinic today with no complaints.    Pap Smear: Pap smear completed today. Last Pap smear was 10/18/21 and was normal. Per patient has no history of an abnormal Pap smear. Last Pap smear result is available in Epic.   Physical exam: Breasts Breasts symmetrical. No skin abnormalities bilateral breasts. No nipple retraction bilateral breasts. No nipple discharge bilateral breasts. No lymphadenopathy. No lumps palpated bilateral breasts.   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 This was found to be concordant by Dr. Britta Mccreedy. 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 This was found to be concordant by Dr. Britta Mccreedy. Pathology results were discussed with the patient by telephone by Durene Cal, Bilingual patient Services Representative. The patient reported doing well after the biopsies with tenderness at the sites. Post biopsy instructions and care were reviewed and questions were answered. The patient was encouraged to call [facility] for any additional concerns. The patient was instructed to return for annual screening mammography in November 2021. Pathology results reported by Rene Kocher, RN on 09/27/2019. Electronically Signed   By: Britta Mccreedy M.D.   On: 09/27/2019 13:14   Result Date: 09/28/2019 CLINICAL DATA:  Stereotactic biopsy was recommended of calcifications in the upper central right breast. EXAM: RIGHT BREAST STEREOTACTIC CORE NEEDLE BIOPSY COMPARISON:  Previous exams. FINDINGS: The patient and I discussed the procedure of stereotactic-guided biopsy including benefits and alternatives. We  discussed the high likelihood of a successful procedure. We discussed the risks of the procedure including infection, bleeding, tissue injury, clip migration, and inadequate sampling. Informed written consent was given. The usual time out protocol was performed immediately prior to the procedure. Using sterile technique and 1% Lidocaine as local anesthetic, under stereotactic guidance, a 9 gauge vacuum assisted device was used to perform core needle biopsy of calcifications in the upper central/slightly outer right breast using a superior approach. Specimen radiograph was performed showing multiple calcifications. Specimens with calcifications are identified for pathology. Lesion quadrant: Upper inner quadrant At the conclusion of the procedure, coil tissue marker clip was deployed into the biopsy cavity. Follow-up 2-view mammogram was performed and dictated separately. IMPRESSION: Stereotactic-guided biopsy of the right breast. No apparent complications. Electronically Signed: By: Britta Mccreedy M.D. On: 09/26/2019 08:40   MM CLIP PLACEMENT RIGHT  Result Date: 09/26/2019 CLINICAL DATA:  Two image guided biopsies were performed of the right breast today. EXAM: DIAGNOSTIC RIGHT MAMMOGRAM POST ULTRASOUND AND STEREOTACTIC BIOPSIES COMPARISON:  Previous exam(s). FINDINGS: Mammographic images were obtained following ultrasound guided biopsy of a right breast mass at 12:30 position 2 cm from nipple. The ribbon shaped biopsy marking clip is in the biopsied mass, in satisfactory position. The coil shaped biopsy clip is satisfactorily positioned in the upper central/slightly inner right breast at the site of the biopsied calcifications. IMPRESSION: Appropriate positioning of the ribbon and coil shaped biopsy marking clips at the 2 biopsy sites in the right breast. Final Assessment: Post Procedure Mammograms for Marker Placement Electronically Signed   By: Britta Mccreedy M.D.   On: 09/26/2019 08:41   MS DIGITAL DIAG UNI  RIGHT  Result Date: 09/17/2019 CLINICAL DATA:  Screening recall for right  breast calcifications. EXAM: DIGITAL DIAGNOSTIC RIGHT MAMMOGRAM ULTRASOUND RIGHT BREAST COMPARISON:  Screening study, which was the baseline exam, 07/12/2019. ACR Breast Density Category c: The breast tissue is heterogeneously dense, which may obscure small masses. FINDINGS: The calcifications in the posterior, upper central right breast, are better evaluated on the magnification images. These are predominantly punctate, but are somewhat linearly arranged spanning 1.5 cm in long axis. Within these areas a more focal group that spans 5 mm. There is no associated mass or distortion. Inferior and medial to the calcifications, there is a circumscribed oval mass. No other right breast abnormalities. Targeted ultrasound is performed, showing an oval circumscribed hypoechoic mass in the right breast at 12:30 o'clock, 2 cm the nipple, measuring 11 x 6 x 11 mm, corresponding to the mammographic mass. Sonographic evaluation of the right axilla shows no enlarged or abnormal lymph nodes. IMPRESSION: 1. Indeterminate right breast calcifications. Tissue sampling is recommended. 2. Probably benign right breast mass. Since the calcifications in this breasts are to undergo biopsy, biopsy is also recommended for this mass to confirm it as a benign lesion. RECOMMENDATION: 1. Stereotactic core needle biopsy of the right breast calcifications. 2. Ultrasound-guided core needle biopsy of the 12:30 o'clock position right breast mass. These procedures were scheduled prior to the patient leaving the breast Center. I have discussed the findings and recommendations with the patient. If applicable, a reminder letter will be sent to the patient regarding the next appointment. BI-RADS CATEGORY  4: Suspicious. Electronically Signed   By: Amie Portland M.D.   On: 09/17/2019 10:38   MS DIGITAL SCREENING TOMO BILATERAL  Result Date: 07/17/2019 CLINICAL DATA:  Screening.  EXAM: DIGITAL SCREENING BILATERAL MAMMOGRAM WITH TOMO AND CAD COMPARISON:  None. ACR Breast Density Category c: The breast tissue is heterogeneously dense, which may obscure small masses. FINDINGS: In the right breast, calcifications warrant further evaluation with magnified views. In the left breast, no findings suspicious for malignancy. Images were processed with CAD. IMPRESSION: Further evaluation is suggested for calcifications in the right breast. RECOMMENDATION: Diagnostic mammogram of the right breast. (Code:[id number]) The patient will be contacted regarding the findings, and additional imaging will be scheduled. BI-RADS CATEGORY  0: Incomplete. Need additional imaging evaluation and/or prior mammograms for comparison. Electronically Signed   By: Sande Brothers M.D.   On: 07/17/2019 07:39        Pelvic/Bimanual Ext Genitalia No lesions, no swelling and no discharge observed on external genitalia.        Vagina Vagina pink and normal texture. No lesions or discharge observed in vagina.        Cervix Cervix is present. Cervix pink and of normal texture. No discharge observed.    Uterus Uterus is present and palpable. Uterus in normal position and normal size.        Adnexae Bilateral ovaries present and palpable. No tenderness on palpation.         Rectovaginal No rectal exam completed today since patient had no rectal complaints. No skin abnormalities observed on exam.     Smoking History: Patient has never smoked and was not referred to quit line.    Patient Navigation: Patient education provided. Access to services provided for patient through [facility] program. Natale Lay interpreter provided. No transportation provided   Colorectal Cancer Screening: Per patient has never had colonoscopy completed No complaints today. FIT test given.   Breast and Cervical Cancer Risk Assessment: Patient does not have family history of breast cancer, known genetic mutations, or  radiation  treatment to the chest before age 51. Patient does not have history of cervical dysplasia, immunocompromised, or DES exposure in-utero.  Risk Assessment   No risk assessment data for the current encounter  Risk Scores       07/12/2019   Last edited by: Priscille Heidelberg, RN   5-year risk: 0.5 %   Lifetime risk: 5.4 %            A: [facility] exam with pap smear No complaints with benign exam.   P: [facility]erred [facility] to the [facility] for a screening mamm[facility]. Appointment scheduled 01/27/23.  Ilda Basset A, NP 01/27/2023 2:07 PM

## 2023-01-27 NOTE — Patient Instructions (Signed)
Taught Marcelino Duster about self breast awareness and gave educational materials to take home. Patient did need a Pap smear today due to last Pap smear was in 2020 per patient.  Let her know [facility] will cover Pap smears every 5 years unless has a history of abnormal Pap smears. Referred patient to [facility] for screening mammogram. Appointment scheduled for 01/27/23. Patient aware of appointment and will be there. Let patient know will follow up with her within the next couple weeks with results. Lamonte Sakai Salcedo verbalized understanding.  Pascal Lux, NP 2:08 PM

## 2023-01-31 ENCOUNTER — Telehealth: Payer: Self-pay

## 2023-01-31 NOTE — Telephone Encounter (Signed)
I contacted [facility] medical records department to request pts PAP report as pt indicated she had a PAP there around 2022. Two representatives checked their systems and found that this pt has not been seen within their practice at any time, nor at any of their locations.
